# Patient Record
Sex: Male | Born: 1960
Health system: Southern US, Community
[De-identification: ages and names within clinical notes are randomized; demographics above are authoritative.]

---

## 2001-04-01 ENCOUNTER — Encounter (HOSPITAL_COMMUNITY): Admission: RE | Admit: 2001-04-01 | Discharge: 2001-05-01 | Payer: Self-pay | Admitting: Internal Medicine

## 2003-02-04 ENCOUNTER — Ambulatory Visit (HOSPITAL_COMMUNITY): Admission: RE | Admit: 2003-02-04 | Discharge: 2003-02-04 | Payer: Self-pay | Admitting: Internal Medicine

## 2003-02-04 ENCOUNTER — Encounter: Payer: Self-pay | Admitting: Internal Medicine

## 2005-03-23 ENCOUNTER — Ambulatory Visit (HOSPITAL_COMMUNITY): Admission: RE | Admit: 2005-03-23 | Discharge: 2005-03-23 | Payer: Self-pay | Admitting: Internal Medicine

## 2005-06-15 ENCOUNTER — Ambulatory Visit (HOSPITAL_COMMUNITY): Admission: RE | Admit: 2005-06-15 | Discharge: 2005-06-15 | Payer: Self-pay | Admitting: Internal Medicine

## 2010-10-17 ENCOUNTER — Ambulatory Visit (HOSPITAL_COMMUNITY)
Admission: RE | Admit: 2010-10-17 | Discharge: 2010-10-17 | Payer: Self-pay | Source: Home / Self Care | Attending: Internal Medicine | Admitting: Internal Medicine

## 2010-10-27 ENCOUNTER — Encounter (HOSPITAL_COMMUNITY)
Admission: RE | Admit: 2010-10-27 | Discharge: 2010-11-26 | Payer: Self-pay | Source: Home / Self Care | Attending: Internal Medicine | Admitting: Internal Medicine

## 2010-11-28 ENCOUNTER — Encounter (HOSPITAL_COMMUNITY)
Admission: RE | Admit: 2010-11-28 | Discharge: 2010-11-28 | Payer: Self-pay | Source: Home / Self Care | Attending: Internal Medicine | Admitting: Internal Medicine

## 2010-12-06 ENCOUNTER — Ambulatory Visit (HOSPITAL_COMMUNITY)
Admission: RE | Admit: 2010-12-06 | Discharge: 2010-12-06 | Disposition: A | Discharge status: Home / Self Care | Payer: No Typology Code available for payment source | Source: Ambulatory Visit | Attending: Internal Medicine | Admitting: Internal Medicine

## 2010-12-06 DIAGNOSIS — IMO0001 Reserved for inherently not codable concepts without codable children: Secondary | ICD-10-CM | POA: Insufficient documentation

## 2010-12-06 DIAGNOSIS — M545 Low back pain, unspecified: Secondary | ICD-10-CM | POA: Insufficient documentation

## 2010-12-06 DIAGNOSIS — R51 Headache: Secondary | ICD-10-CM | POA: Insufficient documentation

## 2010-12-06 DIAGNOSIS — M6281 Muscle weakness (generalized): Secondary | ICD-10-CM | POA: Insufficient documentation

## 2010-12-06 DIAGNOSIS — M542 Cervicalgia: Secondary | ICD-10-CM | POA: Insufficient documentation

## 2010-12-07 ENCOUNTER — Encounter (HOSPITAL_COMMUNITY)
Admission: RE | Admit: 2010-12-07 | Discharge: 2010-12-07 | Disposition: A | Discharge status: Home / Self Care | Payer: No Typology Code available for payment source | Source: Ambulatory Visit | Attending: Internal Medicine | Admitting: Internal Medicine

## 2010-12-07 DIAGNOSIS — M545 Low back pain, unspecified: Secondary | ICD-10-CM | POA: Insufficient documentation

## 2010-12-07 DIAGNOSIS — IMO0001 Reserved for inherently not codable concepts without codable children: Secondary | ICD-10-CM | POA: Insufficient documentation

## 2010-12-07 DIAGNOSIS — M539 Dorsopathy, unspecified: Secondary | ICD-10-CM | POA: Insufficient documentation

## 2010-12-12 ENCOUNTER — Encounter (HOSPITAL_COMMUNITY)
Admission: RE | Admit: 2010-12-12 | Discharge: 2010-12-12 | Disposition: A | Discharge status: Home / Self Care | Payer: No Typology Code available for payment source | Source: Ambulatory Visit | Attending: Internal Medicine | Admitting: Internal Medicine

## 2010-12-14 ENCOUNTER — Ambulatory Visit (HOSPITAL_COMMUNITY)
Admission: RE | Admit: 2010-12-14 | Discharge: 2010-12-14 | Disposition: A | Discharge status: Home / Self Care | Payer: No Typology Code available for payment source | Source: Ambulatory Visit | Attending: Internal Medicine | Admitting: Internal Medicine

## 2010-12-14 ENCOUNTER — Ambulatory Visit (HOSPITAL_COMMUNITY): Payer: No Typology Code available for payment source | Admitting: Physical Therapy

## 2010-12-14 DIAGNOSIS — M545 Low back pain, unspecified: Secondary | ICD-10-CM | POA: Insufficient documentation

## 2010-12-14 DIAGNOSIS — IMO0001 Reserved for inherently not codable concepts without codable children: Secondary | ICD-10-CM | POA: Insufficient documentation

## 2010-12-14 DIAGNOSIS — M6281 Muscle weakness (generalized): Secondary | ICD-10-CM | POA: Insufficient documentation

## 2010-12-14 DIAGNOSIS — M542 Cervicalgia: Secondary | ICD-10-CM | POA: Insufficient documentation

## 2010-12-19 ENCOUNTER — Ambulatory Visit (HOSPITAL_COMMUNITY)
Admission: RE | Admit: 2010-12-19 | Discharge: 2010-12-19 | Disposition: A | Discharge status: Home / Self Care | Payer: No Typology Code available for payment source | Source: Ambulatory Visit | Attending: Internal Medicine | Admitting: Internal Medicine

## 2010-12-19 DIAGNOSIS — IMO0001 Reserved for inherently not codable concepts without codable children: Secondary | ICD-10-CM | POA: Insufficient documentation

## 2010-12-19 DIAGNOSIS — M6281 Muscle weakness (generalized): Secondary | ICD-10-CM | POA: Insufficient documentation

## 2010-12-19 DIAGNOSIS — M542 Cervicalgia: Secondary | ICD-10-CM | POA: Insufficient documentation

## 2010-12-19 DIAGNOSIS — M545 Low back pain, unspecified: Secondary | ICD-10-CM | POA: Insufficient documentation

## 2010-12-21 ENCOUNTER — Ambulatory Visit (HOSPITAL_COMMUNITY)
Admission: RE | Admit: 2010-12-21 | Discharge: 2010-12-21 | Disposition: A | Discharge status: Home / Self Care | Payer: No Typology Code available for payment source | Source: Ambulatory Visit | Attending: *Deleted | Admitting: *Deleted

## 2011-11-08 ENCOUNTER — Emergency Department (HOSPITAL_COMMUNITY)
Admission: EM | Admit: 2011-11-08 | Discharge: 2011-11-08 | Disposition: A | Discharge status: Home / Self Care | Payer: No Typology Code available for payment source | Attending: Emergency Medicine | Admitting: Emergency Medicine

## 2011-11-08 ENCOUNTER — Encounter (HOSPITAL_COMMUNITY): Payer: Self-pay | Admitting: Emergency Medicine

## 2011-11-08 ENCOUNTER — Emergency Department (HOSPITAL_COMMUNITY): Payer: No Typology Code available for payment source

## 2011-11-08 DIAGNOSIS — F17200 Nicotine dependence, unspecified, uncomplicated: Secondary | ICD-10-CM | POA: Insufficient documentation

## 2011-11-08 DIAGNOSIS — R0789 Other chest pain: Secondary | ICD-10-CM | POA: Insufficient documentation

## 2011-11-08 DIAGNOSIS — Z7982 Long term (current) use of aspirin: Secondary | ICD-10-CM | POA: Insufficient documentation

## 2011-11-08 DIAGNOSIS — I4460 Unspecified fascicular block: Secondary | ICD-10-CM | POA: Insufficient documentation

## 2011-11-08 LAB — BASIC METABOLIC PANEL
Chloride: 106 mEq/L (ref 96–112)
Creatinine, Ser: 0.74 mg/dL (ref 0.50–1.35)
GFR calc Af Amer: >90 mL/min (ref >90)

## 2011-11-08 LAB — CBC: RBC: 4.61 MIL/uL (ref 4.22–5.81)

## 2011-11-08 LAB — CARDIAC PANEL(CRET KIN+CKTOT+MB+TROPI)
CK, MB: 2.4 ng/mL (ref 0.3–4.0)
Total CK: 152 U/L (ref 7–232)

## 2011-11-08 LAB — TROPONIN I: Troponin I: <0.3 ng/mL (ref <0.30)

## 2011-11-08 MED ORDER — ASPIRIN 81 MG PO CHEW
324.0000 mg | CHEWABLE_TABLET | Freq: Once | ORAL | Status: AC
  Administered 2011-11-08: 324 mg via ORAL
  Filled 2011-11-08: qty 4

## 2011-11-08 NOTE — ED Notes (Addendum)
Pt states he was told to come to ed by Dr.Fagan. Pt states he thinks he had a heart attack on Christmas eve, but was not seen.Pt c/o some chest discomfort and lightheadedness this am.

## 2011-11-08 NOTE — ED Provider Notes (Signed)
History     CSN: [id number]  Arrival date & time 11/08/11  4098   First MD Initiated Contact with Patient 11/08/11 1007      Chief Complaint  Patient presents with  . Chest Pain    (Consider location/radiation/quality/duration/timing/severity/associated sxs/prior treatment) Patient is a 51 y.o. male presenting with chest pain. The history is provided by the patient.  Chest Pain The chest pain began more than 2 weeks ago (He describes a 2-3 minute episode fo severe chest pain,  described as "my body was twisting around my heart" on christmas eve.  He reports the same episode about 6 months ago.). Episode Length: Had an episode of chest discomfort this am along with lightheadedness.  Stopped by his pcp and was sent here for testing.  He denies symptoms at present.  This mornings event occured at rest,  denies sob,  nausea or diaphoresis.   Chest pain occurs intermittently. The chest pain is resolved. Associated with: nothing. At its most intense, the pain is at 2/10. The pain is currently at 0/10. The severity of the pain is mild. The quality of the pain is described as brief and dull. The pain does not radiate (The pain on christmas eve radiated to his neck and shoulders.  Todays discomfort was without radiation). Pertinent negatives for primary symptoms include no fever, no shortness of breath, no cough, no palpitations, no abdominal pain, no nausea and no dizziness.  Pertinent negatives for associated symptoms include no claudication, no diaphoresis, no lower extremity edema, no near-syncope, no numbness, no orthopnea, no paroxysmal nocturnal dyspnea and no weakness. He tried nothing for the symptoms. Risk factors include smoking/tobacco exposure.  His past medical history is significant for cancer.  Pertinent negatives for past medical history include no CAD, no diabetes, no hyperlipidemia and no hypertension.  Pertinent negatives for family medical history include: no CAD in family, no  diabetes in family, no heart disease in family, no hyperlipidemia in family and no early MI in family.     History reviewed. No pertinent past medical history.  History reviewed. No pertinent past surgical history.  No family history on file.  History  Substance Use Topics  . Smoking status: Current Everyday Smoker  . Smokeless tobacco: Not on file  . Alcohol Use: Yes     rarely      Review of Systems  Constitutional: Negative for fever and diaphoresis.  HENT: Negative for congestion, sore throat and neck pain.   Eyes: Negative.   Respiratory: Negative for cough, chest tightness and shortness of breath.   Cardiovascular: Positive for chest pain. Negative for palpitations, orthopnea, claudication and near-syncope.  Gastrointestinal: Negative for nausea and abdominal pain.  Genitourinary: Negative.   Musculoskeletal: Negative for joint swelling and arthralgias.  Skin: Negative.  Negative for rash and wound.  Neurological: Negative for dizziness, weakness, light-headedness, numbness and headaches.  Hematological: Negative.   Psychiatric/Behavioral: Negative.     Allergies  Review of patient's allergies indicates no known allergies.  Home Medications   Current Outpatient Rx  Name Route Sig Dispense Refill  . ASPIRIN EC 81 MG PO TBEC Oral Take 81 mg by mouth daily.      BP 112/73  Pulse 56  Temp 98.1 F (36.7 C)  Resp 13  Ht 5\' 9"  (1.753 m)  Wt 165 lb (74.844 kg)  BMI 24.37 kg/m2  SpO2 97%  Physical Exam  Nursing note and vitals reviewed. Constitutional: He is oriented to person, place, and time. He appears  well-developed and well-nourished.  HENT:  Head: Normocephalic and atraumatic.  Eyes: Conjunctivae are normal.  Neck: Normal range of motion.  Cardiovascular: Normal rate, regular rhythm, normal heart sounds and intact distal pulses.  Exam reveals no gallop and no friction rub.   No murmur heard. Pulmonary/Chest: Effort normal and breath sounds normal. He  has no wheezes. He exhibits no tenderness.  Abdominal: Soft. Bowel sounds are normal. There is no tenderness.  Musculoskeletal: Normal range of motion.  Neurological: He is alert and oriented to person, place, and time.  Skin: Skin is warm and dry.  Psychiatric: He has a normal mood and affect.    ED Course  Procedures (including critical care time)  Labs Reviewed  BASIC METABOLIC PANEL - Abnormal; Notable for the following:    Glucose, Bld 110 (*)    All other components within normal limits  CBC  CARDIAC PANEL(CRET KIN+CKTOT+MB+TROPI)  TROPONIN I  LAB REPORT - SCANNED   No results found.   1. Chest pain, atypical       MDM  Troponin x 2 normal range.  Low risk for CAD.  Patient sx free throughout ed visit.  Discussed with Dr. Colon Branch prior to disposition.  Referral to pcp for recheck this week.     Date: 11/08/2011  Rate: 59  Rhythm: sinus bradycardia  QRS Axis: normal  Intervals: normal  ST/T Wave abnormalities: normal  Conduction Disutrbances:left anterior fascicular block  Narrative Interpretation:   Old EKG Reviewed: none available       Candis Musa, PA 11/10/11 1243

## 2011-11-08 NOTE — ED Notes (Signed)
Pt states that he is pretty sure that he had a heart attack on Christmas Eve but did not act on it. Pt states that he went to his MD this morning and was sent to the ED. Pt alert and oriented x 3. Skin warm and dry. Color pink. Breath sounds clear and equal bilaterally. Pt placed on cardiac monitor showing NSR. Pt c/o some discomfort in his chest earlier this am but none at present. Denies shortness of breath or nausea. States that this is the first discomfort since the severe chest pain on Christmas Eve.

## 2011-11-08 NOTE — ED Notes (Signed)
edpa in to eval 

## 2011-11-10 NOTE — ED Provider Notes (Signed)
Medical screening examination/treatment/procedure(s) were performed by non-physician practitioner and as supervising physician I was immediately available for consultation/collaboration.  Nicoletta Dress. Colon Branch, MD 11/10/11 2224

## 2016-02-14 DIAGNOSIS — M2550 Pain in unspecified joint: Secondary | ICD-10-CM | POA: Diagnosis not present

## 2016-02-15 DIAGNOSIS — M2550 Pain in unspecified joint: Secondary | ICD-10-CM | POA: Diagnosis not present

## 2016-02-15 DIAGNOSIS — Z79899 Other long term (current) drug therapy: Secondary | ICD-10-CM | POA: Diagnosis not present

## 2016-04-03 DIAGNOSIS — F419 Anxiety disorder, unspecified: Secondary | ICD-10-CM | POA: Diagnosis not present

## 2016-04-03 DIAGNOSIS — M1990 Unspecified osteoarthritis, unspecified site: Secondary | ICD-10-CM | POA: Diagnosis not present

## 2016-05-25 DIAGNOSIS — M1990 Unspecified osteoarthritis, unspecified site: Secondary | ICD-10-CM | POA: Diagnosis not present

## 2016-05-25 DIAGNOSIS — F411 Generalized anxiety disorder: Secondary | ICD-10-CM | POA: Diagnosis not present

## 2016-06-28 DIAGNOSIS — F411 Generalized anxiety disorder: Secondary | ICD-10-CM | POA: Diagnosis not present

## 2016-10-26 DIAGNOSIS — F411 Generalized anxiety disorder: Secondary | ICD-10-CM | POA: Diagnosis not present

## 2017-01-24 DIAGNOSIS — M545 Low back pain: Secondary | ICD-10-CM | POA: Diagnosis not present

## 2017-02-26 DIAGNOSIS — R454 Irritability and anger: Secondary | ICD-10-CM | POA: Diagnosis not present

## 2017-06-13 DIAGNOSIS — B349 Viral infection, unspecified: Secondary | ICD-10-CM | POA: Diagnosis not present

## 2017-12-06 DIAGNOSIS — R454 Irritability and anger: Secondary | ICD-10-CM | POA: Diagnosis not present

## 2017-12-06 DIAGNOSIS — Z6826 Body mass index (BMI) 26.0-26.9, adult: Secondary | ICD-10-CM | POA: Diagnosis not present

## 2017-12-06 DIAGNOSIS — J329 Chronic sinusitis, unspecified: Secondary | ICD-10-CM | POA: Diagnosis not present

## 2018-01-03 DIAGNOSIS — R454 Irritability and anger: Secondary | ICD-10-CM | POA: Diagnosis not present

## 2018-04-11 DIAGNOSIS — R454 Irritability and anger: Secondary | ICD-10-CM | POA: Diagnosis not present

## 2018-04-14 ENCOUNTER — Emergency Department (HOSPITAL_COMMUNITY): Payer: BLUE CROSS/BLUE SHIELD

## 2018-04-14 ENCOUNTER — Encounter (HOSPITAL_COMMUNITY): Payer: Self-pay | Admitting: *Deleted

## 2018-04-14 ENCOUNTER — Other Ambulatory Visit: Payer: Self-pay

## 2018-04-14 ENCOUNTER — Emergency Department (HOSPITAL_COMMUNITY)
Admission: EM | Admit: 2018-04-14 | Discharge: 2018-04-14 | Disposition: A | Discharge status: Home / Self Care | Payer: BLUE CROSS/BLUE SHIELD | Attending: Emergency Medicine | Admitting: Emergency Medicine

## 2018-04-14 DIAGNOSIS — F17210 Nicotine dependence, cigarettes, uncomplicated: Secondary | ICD-10-CM | POA: Diagnosis not present

## 2018-04-14 DIAGNOSIS — R109 Unspecified abdominal pain: Secondary | ICD-10-CM | POA: Diagnosis not present

## 2018-04-14 DIAGNOSIS — R110 Nausea: Secondary | ICD-10-CM | POA: Diagnosis not present

## 2018-04-14 DIAGNOSIS — N200 Calculus of kidney: Secondary | ICD-10-CM | POA: Diagnosis not present

## 2018-04-14 DIAGNOSIS — N23 Unspecified renal colic: Secondary | ICD-10-CM | POA: Diagnosis not present

## 2018-04-14 LAB — CBC WITH DIFFERENTIAL/PLATELET
BASOS ABS: 0 10*3/uL (ref 0.0–0.1)
Basophils Relative: 0 %
EOS ABS: 0.1 10*3/uL (ref 0.0–0.7)
EOS PCT: 0 %
HCT: 45.2 % (ref 39.0–52.0)
Hemoglobin: 14.9 g/dL (ref 13.0–17.0)
LYMPHS ABS: 1.9 10*3/uL (ref 0.7–4.0)
Lymphocytes Relative: 12 %
MCH: 31.2 pg (ref 26.0–34.0)
MCHC: 33 g/dL (ref 30.0–36.0)
MCV: 94.6 fL (ref 78.0–100.0)
Monocytes Absolute: 0.7 10*3/uL (ref 0.1–1.0)
Monocytes Relative: 4 %
Neutro Abs: 13.4 10*3/uL — ABNORMAL HIGH (ref 1.7–7.7)
Neutrophils Relative %: 84 %
PLATELETS: 261 10*3/uL (ref 150–400)
RBC: 4.78 MIL/uL (ref 4.22–5.81)
RDW: 12.8 % (ref 11.5–15.5)
WBC: 16.1 10*3/uL — AB (ref 4.0–10.5)

## 2018-04-14 LAB — BASIC METABOLIC PANEL
Anion gap: 8 (ref 5–15)
BUN: 19 mg/dL (ref 6–20)
CALCIUM: 9.5 mg/dL (ref 8.9–10.3)
CO2: 30 mmol/L (ref 22–32)
Chloride: 103 mmol/L (ref 101–111)
Creatinine, Ser: 1.07 mg/dL (ref 0.61–1.24)
GFR calc Af Amer: >60 mL/min (ref >60)
Glucose, Bld: 164 mg/dL — ABNORMAL HIGH (ref 65–99)
Potassium: 3.7 mmol/L (ref 3.5–5.1)
SODIUM: 141 mmol/L (ref 135–145)

## 2018-04-14 LAB — URINALYSIS, ROUTINE W REFLEX MICROSCOPIC
Bacteria, UA: NONE SEEN
Bilirubin Urine: NEGATIVE
Glucose, UA: NEGATIVE mg/dL
Ketones, ur: NEGATIVE mg/dL
Leukocytes, UA: NEGATIVE
Nitrite: NEGATIVE
PH: 6 (ref 5.0–8.0)
Protein, ur: NEGATIVE mg/dL
SPECIFIC GRAVITY, URINE: 1.02 (ref 1.005–1.030)

## 2018-04-14 MED ORDER — OXYCODONE-ACETAMINOPHEN 5-325 MG PO TABS: 1.0000 | ORAL_TABLET | Freq: Three times a day (TID) | ORAL | 0 refills | Status: AC | PRN

## 2018-04-14 MED ORDER — TAMSULOSIN HCL 0.4 MG PO CAPS: 0.4000 mg | ORAL_CAPSULE | Freq: Every day | ORAL | 0 refills | Status: AC

## 2018-04-14 MED ORDER — SODIUM CHLORIDE 0.9 % IV BOLUS
1000.0000 mL | Freq: Once | INTRAVENOUS | Status: AC
  Administered 2018-04-14: 1000 mL via INTRAVENOUS

## 2018-04-14 MED ORDER — MORPHINE SULFATE (PF) 4 MG/ML IV SOLN
6.0000 mg | Freq: Once | INTRAVENOUS | Status: AC
  Administered 2018-04-14: 6 mg via INTRAVENOUS
  Filled 2018-04-14: qty 2

## 2018-04-14 MED ORDER — TAMSULOSIN HCL 0.4 MG PO CAPS
0.4000 mg | ORAL_CAPSULE | Freq: Once | ORAL | Status: AC
  Administered 2018-04-14: 0.4 mg via ORAL
  Filled 2018-04-14: qty 1

## 2018-04-14 MED ORDER — ONDANSETRON HCL 4 MG/2ML IJ SOLN
4.0000 mg | Freq: Once | INTRAMUSCULAR | Status: AC
  Administered 2018-04-14: 4 mg via INTRAVENOUS
  Filled 2018-04-14: qty 2

## 2018-04-14 MED ORDER — ONDANSETRON HCL 4 MG PO TABS: 4.0000 mg | ORAL_TABLET | Freq: Three times a day (TID) | ORAL | 0 refills | Status: AC | PRN

## 2018-04-14 MED ORDER — KETOROLAC TROMETHAMINE 30 MG/ML IJ SOLN
30.0000 mg | Freq: Once | INTRAMUSCULAR | Status: AC
  Administered 2018-04-14: 30 mg via INTRAVENOUS
  Filled 2018-04-14: qty 1

## 2018-04-14 MED ORDER — IBUPROFEN 800 MG PO TABS: 800.0000 mg | ORAL_TABLET | Freq: Three times a day (TID) | ORAL | 0 refills | Status: AC

## 2018-04-14 NOTE — ED Notes (Signed)
Bladder scan was performed at 0346 highest result showed .

## 2018-04-14 NOTE — ED Provider Notes (Signed)
Emergency Department Provider Note   I have reviewed the triage vital signs and the nursing notes.   HISTORY  Chief Complaint Flank Pain   HPI Bill Garcia is a 57 y.o. male without significant past medical history the presents to the emergency department today with flank pain.  Patient states that he has some intermittent left-sided pain for the last week or so but no urinary symptoms but then this morning at 2:00 he woke up with severe left-sided flank pain that radiated around towards his groin.  Had a small amount of urination but has not been able urinate since then even though he has continued urge.  Has had some nausea with this as well.  Has taken ibuprofen at home which did not seem to help with symptoms.  No history of kidney stones or urinary tract infections.  No trauma. No other associated or modifying symptoms.    History reviewed. No pertinent past medical history.  There are no active problems to display for this patient.   History reviewed. No pertinent surgical history.  Current Outpatient Rx  . Order #: [id number] Class: Historical Med  . Order #: [id number] Class: Print  . Order #: [id number] Class: Print  . Order #: [id number] Class: Print  . Order #: [id number] Class: Print    Allergies Patient has no known allergies.  No family history on file.  Social History Social History   Tobacco Use  . Smoking status: Current Every Day Smoker  . Smokeless tobacco: Never Used  Substance Use Topics  . Alcohol use: Yes    Comment: rarely  . Drug use: No    Review of Systems  All other systems negative except as documented in the HPI. All pertinent positives and negatives as reviewed in the HPI. ____________________________________________   PHYSICAL EXAM:  VITAL SIGNS: ED Triage Vitals [04/14/18 0330]  Enc Vitals Group     BP (!) 160/80     Pulse Rate 60     Resp (!) 21     Temp (!) 97.4 F (36.3 C)     Temp Source Oral     SpO2 100 %     Weight  174 lb (78.9 kg)     Height 5\' 8"  (1.727 m)    Constitutional: Alert and oriented. Well appearing and in obvious discomfort. Eyes: Conjunctivae are normal. PERRL. EOMI. Head: Atraumatic. Nose: No congestion/rhinnorhea. Mouth/Throat: Mucous membranes are moist.  Oropharynx non-erythematous. Neck: No stridor.  No meningeal signs.   Cardiovascular: Normal rate, regular rhythm. Good peripheral circulation. Grossly normal heart sounds.   Respiratory: Normal respiratory effort.  No retractions. Lungs CTAB. Gastrointestinal: Soft and ttp in suprapubic area. No distention.  Musculoskeletal: No lower extremity tenderness nor edema. No gross deformities of extremities. Left cva ttp. Neurologic:  Normal speech and language. No gross focal neurologic deficits are appreciated.  Skin:  Skin is warm, dry and intact. No rash noted.  ____________________________________________   LABS (all labs ordered are listed, but only abnormal results are displayed)  Labs Reviewed  CBC WITH DIFFERENTIAL/PLATELET - Abnormal; Notable for the following components:      Result Value   WBC 16.1 (*)    Neutro Abs 13.4 (*)    All other components within normal limits  BASIC METABOLIC PANEL - Abnormal; Notable for the following components:   Glucose, Bld 164 (*)    All other components within normal limits  URINALYSIS, ROUTINE W REFLEX MICROSCOPIC - Abnormal; Notable for the following components:   APPearance HAZY (*)  Hgb urine dipstick SMALL (*)    All other components within normal limits   ____________________________________________  RADIOLOGY  Ct Renal Stone Study  Result Date: 04/14/2018 CLINICAL DATA:  Left flank pain with nausea. Woke patient from sleep. EXAM: CT ABDOMEN AND PELVIS WITHOUT CONTRAST TECHNIQUE: Multidetector CT imaging of the abdomen and pelvis was performed following the standard protocol without IV contrast. COMPARISON:  None. FINDINGS: Lower chest: Lung bases are clear.  Hepatobiliary: No focal liver abnormality is seen. No gallstones, gallbladder wall thickening, or biliary dilatation. Pancreas: Unremarkable. No pancreatic ductal dilatation or surrounding inflammatory changes. Spleen: Normal in size without focal abnormality. Adrenals/Urinary Tract: No adrenal gland nodules. 3 mm stone in the left bladder base may represent a recently passed stone or a stone in the distal ureterovesical junction. There is hydronephrosis and hydroureter on the left with stranding around the left kidney in ureter suggesting sequela of obstruction. Diffuse bladder wall thickening may indicate cystitis. Punctate sized stones demonstrated in both kidneys. No hydronephrosis or hydroureter on the right. Vague low-attenuation lesion in the medial left kidney likely representing a small cyst. Stomach/Bowel: Stomach is within normal limits. Appendix appears normal. No evidence of bowel wall thickening, distention, or inflammatory changes. Vascular/Lymphatic: Aortic atherosclerosis. No enlarged abdominal or pelvic lymph nodes. Reproductive: Prostate gland is not enlarged. Other: No free air or free fluid in the abdomen. Abdominal wall musculature appears intact. Musculoskeletal: No acute or significant osseous findings. IMPRESSION: 1. 3 mm stone in the left bladder base may represent a recently passed stone into the bladder or a stone in the distal ureterovesical junction. Moderate postobstructive changes in the left kidney and ureter. 2. Multiple bilateral nonobstructing intrarenal stones. 3. Bladder wall thickening may indicate cystitis. 4. Aortic atherosclerosis. Electronically Signed   By: Burman Nieves M.D.   On: 04/14/2018 04:45    ____________________________________________   PROCEDURES  Procedure(s) performed:   Procedures   ____________________________________________   INITIAL IMPRESSION / ASSESSMENT AND PLAN / ED COURSE  Pain meds/antiemetics, eval for stone vs uti.  Found  to have stone. Toradol/flomax given. Pain controlled. Nausea controlled. No infection. Stable for dc on same meds.      Pertinent labs & imaging results that were available during my care of the patient were reviewed by me and considered in my medical decision making (see chart for details).  ____________________________________________  FINAL CLINICAL IMPRESSION(S) / ED DIAGNOSES  Final diagnoses:  Renal colic  Kidney stone     MEDICATIONS GIVEN DURING THIS VISIT:  Medications  morphine 4 MG/ML injection 6 mg (6 mg Intravenous Given 04/14/18 0401)  ondansetron (ZOFRAN) injection 4 mg (4 mg Intravenous Given 04/14/18 0401)  sodium chloride 0.9 % bolus 1,000 mL (0 mLs Intravenous Stopped 04/14/18 0518)  ketorolac (TORADOL) 30 MG/ML injection 30 mg (30 mg Intravenous Given 04/14/18 0525)  tamsulosin (FLOMAX) capsule 0.4 mg (0.4 mg Oral Given 04/14/18 0525)     NEW OUTPATIENT MEDICATIONS STARTED DURING THIS VISIT:  Discharge Medication List as of 04/14/2018  6:45 AM    START taking these medications   Details  ibuprofen (ADVIL,MOTRIN) 800 MG tablet Take 1 tablet (800 mg total) by mouth 3 (three) times daily., Starting Mon 04/14/2018, Print    ondansetron (ZOFRAN) 4 MG tablet Take 1 tablet (4 mg total) by mouth every 8 (eight) hours as needed for nausea or vomiting., Starting Mon 04/14/2018, Print    oxyCODONE-acetaminophen (PERCOCET/ROXICET) 5-325 MG tablet Take 1-2 tablets by mouth every 8 (eight) hours as needed for severe  pain., Starting Mon 04/14/2018, Print    tamsulosin (FLOMAX) 0.4 MG CAPS capsule Take 1 capsule (0.4 mg total) by mouth daily., Starting Mon 04/14/2018, Print        Note:  This note was prepared with assistance of Dragon voice recognition software. Occasional wrong-word or sound-a-like substitutions may have occurred due to the inherent limitations of voice recognition software.   Tavie Haseman, Barbara Cower, MD 04/14/18 [phone]

## 2018-04-14 NOTE — ED Notes (Signed)
ED Provider at bedside. 

## 2018-04-14 NOTE — ED Triage Notes (Signed)
Pt c/o left flank pain that woke him up from sleep with nausea,

## 2018-04-30 DIAGNOSIS — N1330 Unspecified hydronephrosis: Secondary | ICD-10-CM | POA: Diagnosis not present

## 2018-05-02 DIAGNOSIS — R1084 Generalized abdominal pain: Secondary | ICD-10-CM | POA: Diagnosis not present

## 2018-05-02 DIAGNOSIS — N400 Enlarged prostate without lower urinary tract symptoms: Secondary | ICD-10-CM | POA: Diagnosis not present

## 2018-05-02 DIAGNOSIS — N130 Hydronephrosis with ureteropelvic junction obstruction: Secondary | ICD-10-CM | POA: Diagnosis not present

## 2018-05-02 DIAGNOSIS — N200 Calculus of kidney: Secondary | ICD-10-CM | POA: Diagnosis not present

## 2018-05-30 DIAGNOSIS — R05 Cough: Secondary | ICD-10-CM | POA: Diagnosis not present

## 2018-06-06 ENCOUNTER — Ambulatory Visit (HOSPITAL_COMMUNITY)
Admission: RE | Admit: 2018-06-06 | Discharge: 2018-06-06 | Disposition: A | Discharge status: Home / Self Care | Payer: BLUE CROSS/BLUE SHIELD | Source: Ambulatory Visit | Attending: Internal Medicine | Admitting: Internal Medicine

## 2018-06-06 ENCOUNTER — Other Ambulatory Visit (HOSPITAL_COMMUNITY): Payer: Self-pay | Admitting: Internal Medicine

## 2018-06-06 DIAGNOSIS — R059 Cough, unspecified: Secondary | ICD-10-CM

## 2018-06-06 DIAGNOSIS — R05 Cough: Secondary | ICD-10-CM | POA: Diagnosis not present

## 2018-08-15 DIAGNOSIS — R7301 Impaired fasting glucose: Secondary | ICD-10-CM | POA: Diagnosis not present

## 2018-08-15 DIAGNOSIS — Z79899 Other long term (current) drug therapy: Secondary | ICD-10-CM | POA: Diagnosis not present

## 2018-08-15 DIAGNOSIS — Z125 Encounter for screening for malignant neoplasm of prostate: Secondary | ICD-10-CM | POA: Diagnosis not present

## 2018-08-18 DIAGNOSIS — R05 Cough: Secondary | ICD-10-CM | POA: Diagnosis not present

## 2018-09-29 DIAGNOSIS — R05 Cough: Secondary | ICD-10-CM | POA: Diagnosis not present

## 2018-11-24 DIAGNOSIS — R454 Irritability and anger: Secondary | ICD-10-CM | POA: Diagnosis not present

## 2018-11-24 DIAGNOSIS — R05 Cough: Secondary | ICD-10-CM | POA: Diagnosis not present

## 2019-04-09 IMAGING — CR DG CHEST 2V
2 series · 2 of 2 positions shown · non-contrast
Comparison: 11/08/2011

CLINICAL DATA: Productive cough for several months.

EXAM:
CHEST - 2 VIEW

[w chest pa]
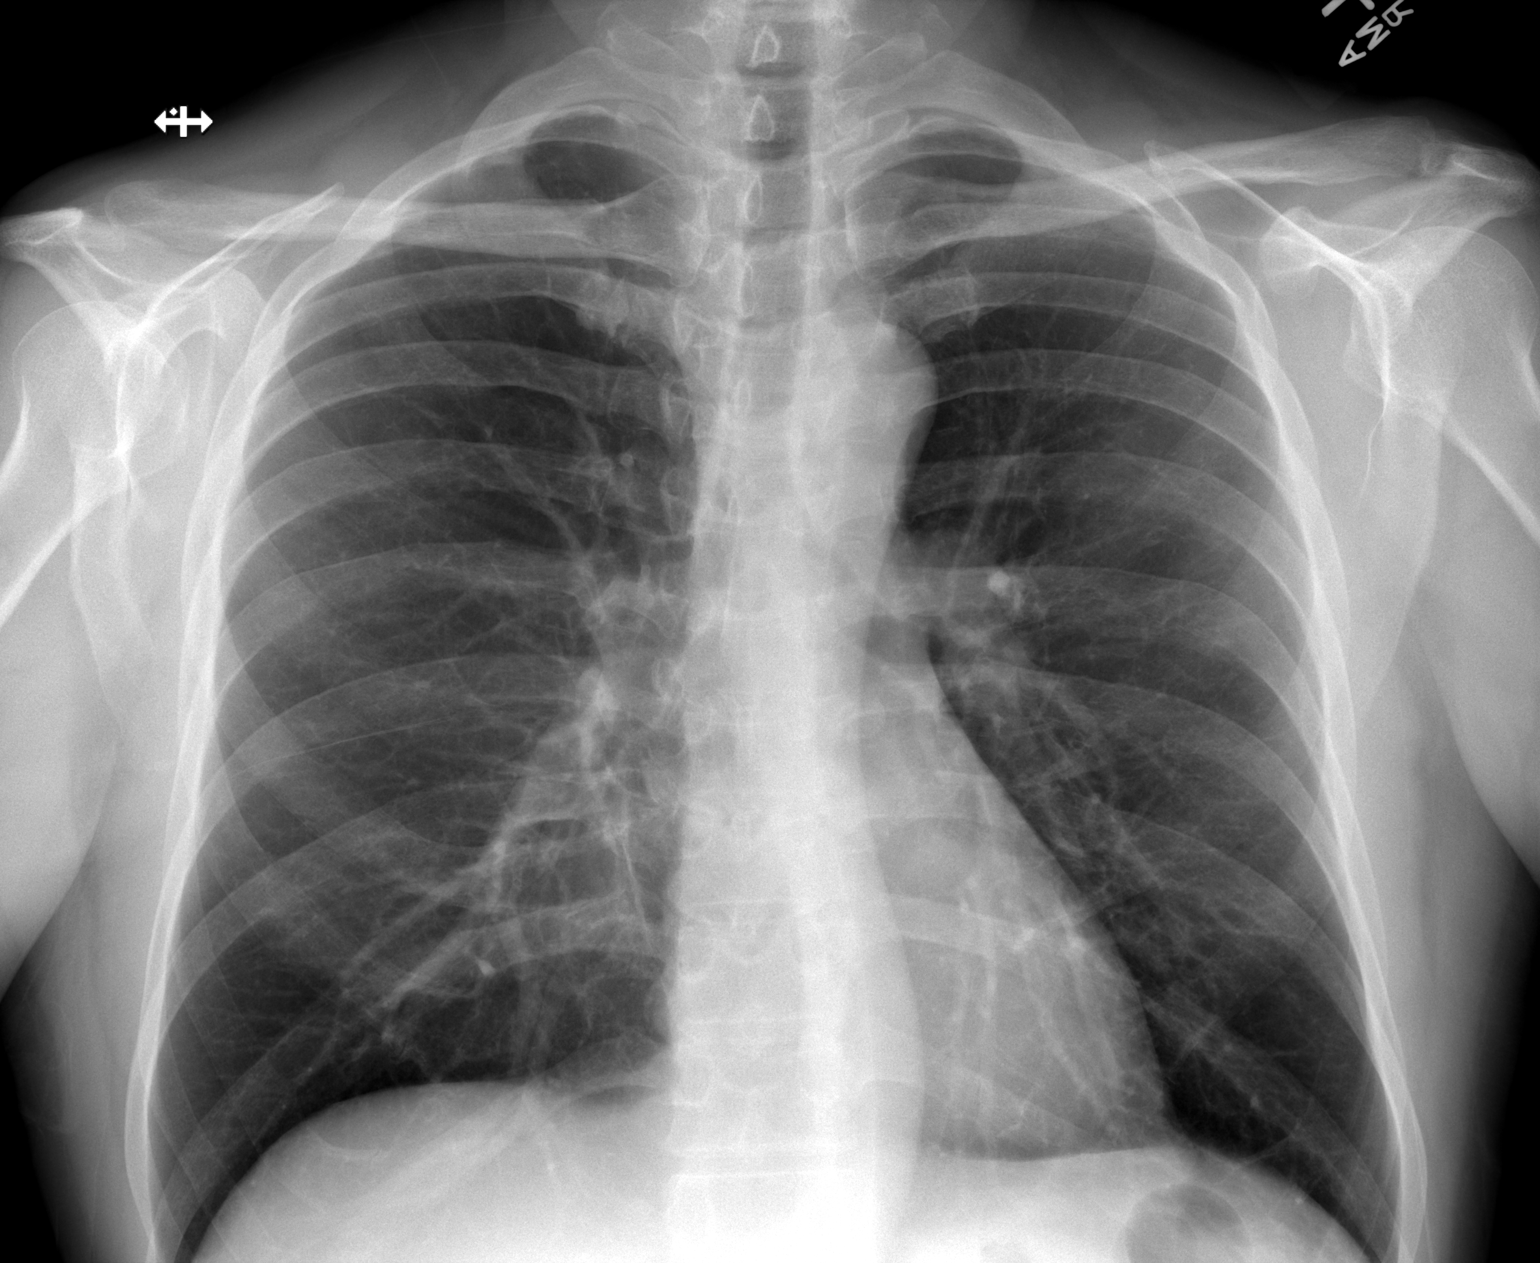

[w chest lat]
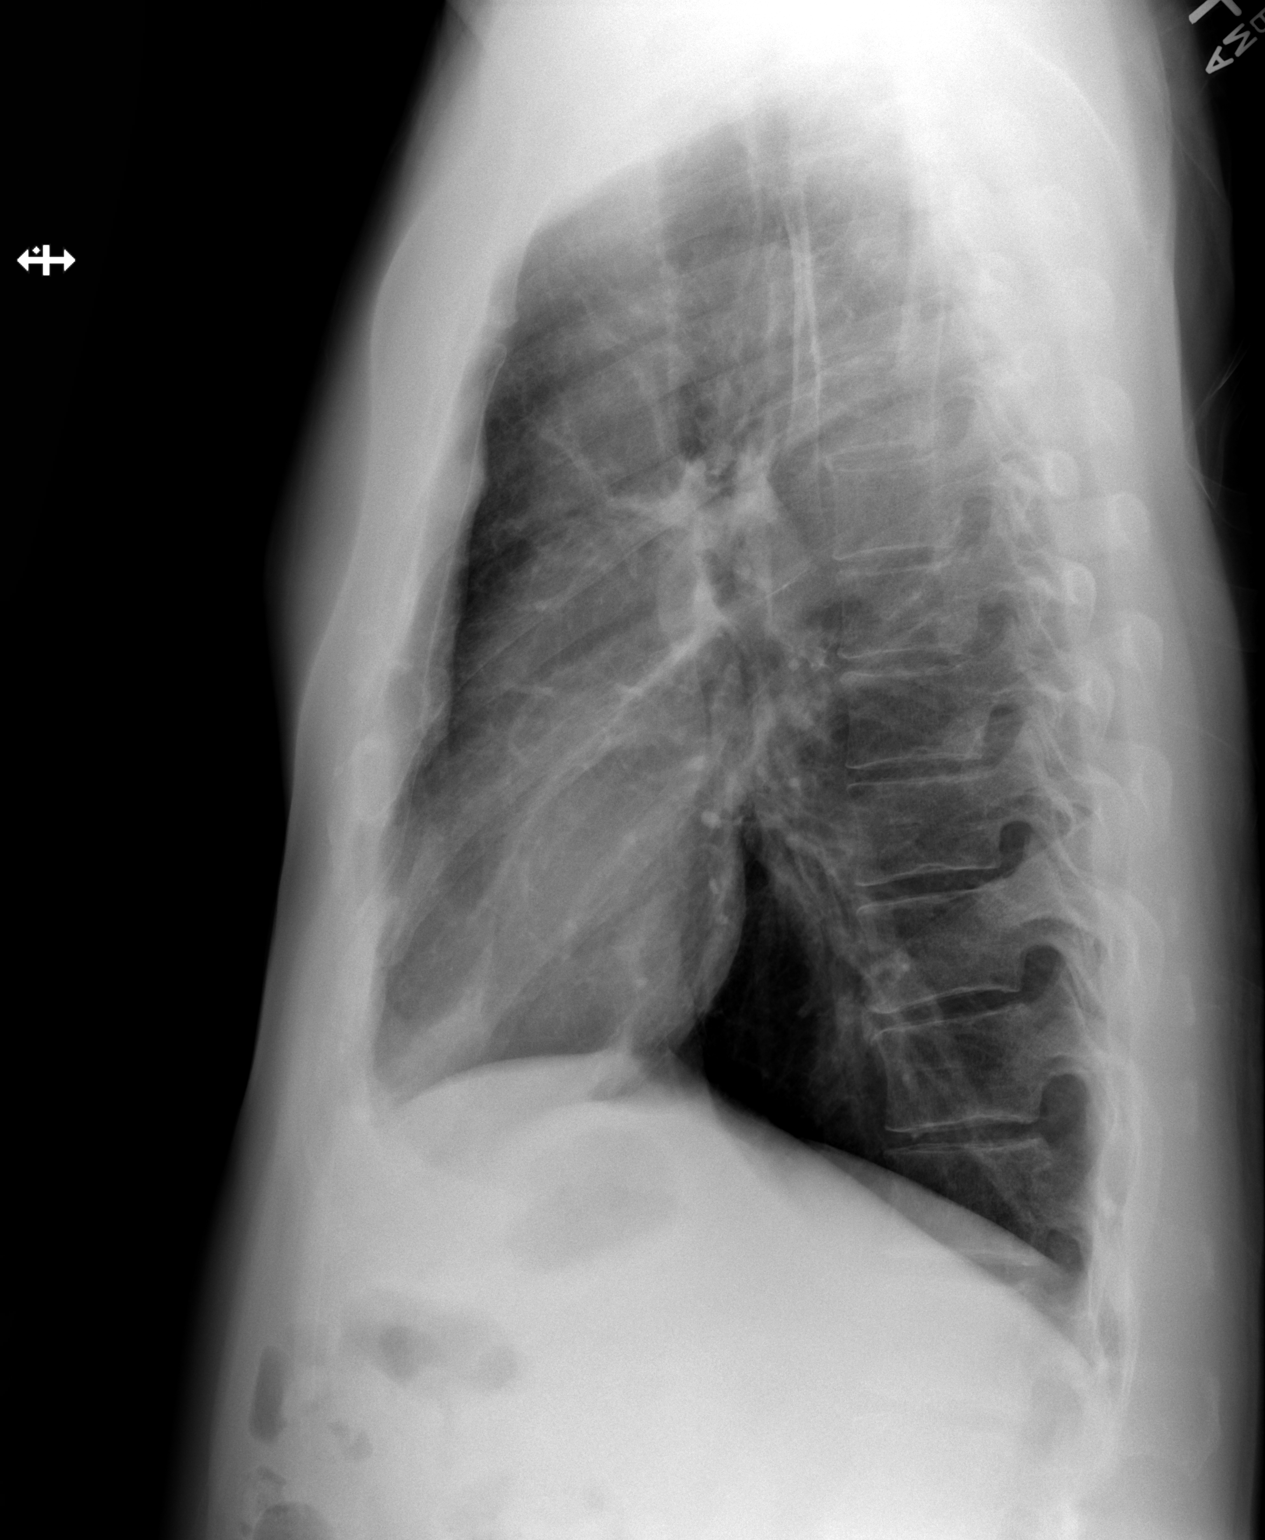

[2 of 2 positions shown; findings below may reference images not displayed]

FINDINGS: The heart size and mediastinal contours are within normal limits.
Both lungs are clear. No pleural effusion or pneumothorax. The
visualized skeletal structures are unremarkable.
IMPRESSION: No active cardiopulmonary disease.

## 2019-08-10 DIAGNOSIS — R05 Cough: Secondary | ICD-10-CM | POA: Diagnosis not present

## 2019-08-10 DIAGNOSIS — R454 Irritability and anger: Secondary | ICD-10-CM | POA: Diagnosis not present

## 2019-11-05 DIAGNOSIS — Z79899 Other long term (current) drug therapy: Secondary | ICD-10-CM | POA: Diagnosis not present

## 2019-11-05 DIAGNOSIS — R454 Irritability and anger: Secondary | ICD-10-CM | POA: Diagnosis not present

## 2019-11-05 DIAGNOSIS — J45991 Cough variant asthma: Secondary | ICD-10-CM | POA: Diagnosis not present

## 2019-11-05 DIAGNOSIS — Z125 Encounter for screening for malignant neoplasm of prostate: Secondary | ICD-10-CM | POA: Diagnosis not present

## 2019-11-12 DIAGNOSIS — R454 Irritability and anger: Secondary | ICD-10-CM | POA: Diagnosis not present

## 2019-11-12 DIAGNOSIS — R05 Cough: Secondary | ICD-10-CM | POA: Diagnosis not present

## 2019-12-01 ENCOUNTER — Other Ambulatory Visit: Payer: Self-pay

## 2019-12-01 ENCOUNTER — Ambulatory Visit: Payer: BC Managed Care – PPO | Attending: Internal Medicine

## 2019-12-01 DIAGNOSIS — Z20822 Contact with and (suspected) exposure to covid-19: Secondary | ICD-10-CM | POA: Diagnosis not present

## 2019-12-02 LAB — NOVEL CORONAVIRUS, NAA: SARS-CoV-2, NAA: DETECTED — AB

## 2021-12-27 DIAGNOSIS — M25561 Pain in right knee: Secondary | ICD-10-CM | POA: Diagnosis not present

## 2021-12-27 DIAGNOSIS — M25562 Pain in left knee: Secondary | ICD-10-CM | POA: Diagnosis not present

## 2021-12-27 DIAGNOSIS — M2241 Chondromalacia patellae, right knee: Secondary | ICD-10-CM | POA: Diagnosis not present

## 2021-12-27 DIAGNOSIS — M2242 Chondromalacia patellae, left knee: Secondary | ICD-10-CM | POA: Diagnosis not present

## 2022-03-06 DIAGNOSIS — R051 Acute cough: Secondary | ICD-10-CM | POA: Diagnosis not present

## 2022-07-25 DIAGNOSIS — M2242 Chondromalacia patellae, left knee: Secondary | ICD-10-CM | POA: Diagnosis not present

## 2022-07-25 DIAGNOSIS — M2241 Chondromalacia patellae, right knee: Secondary | ICD-10-CM | POA: Diagnosis not present

## 2023-01-14 DIAGNOSIS — R454 Irritability and anger: Secondary | ICD-10-CM | POA: Diagnosis not present

## 2023-01-14 DIAGNOSIS — F419 Anxiety disorder, unspecified: Secondary | ICD-10-CM | POA: Diagnosis not present

## 2023-01-14 DIAGNOSIS — Z125 Encounter for screening for malignant neoplasm of prostate: Secondary | ICD-10-CM | POA: Diagnosis not present

## 2023-01-21 DIAGNOSIS — R454 Irritability and anger: Secondary | ICD-10-CM | POA: Diagnosis not present

## 2023-01-21 DIAGNOSIS — R972 Elevated prostate specific antigen [PSA]: Secondary | ICD-10-CM | POA: Diagnosis not present

## 2023-02-25 ENCOUNTER — Other Ambulatory Visit: Payer: Self-pay | Admitting: *Deleted

## 2023-02-25 DIAGNOSIS — Z125 Encounter for screening for malignant neoplasm of prostate: Secondary | ICD-10-CM

## 2023-02-27 ENCOUNTER — Telehealth: Payer: Self-pay

## 2023-02-27 NOTE — Telephone Encounter (Signed)
Normal PSA result letter mailed.   Feb 27, 2023         Dear Bill Garcia,   Thank you for your participation in the Prostate Cancer Screening at [facility] [phone]) on Monday, February 25, 2023.   The result of your blood test for the level of the Prostate Specific Antigen (PSA) was found to be normal. It is recommended that continue yearly screening and share these results with your physician.  If you have any questions about these results, please call Bill Garcia at [phone] or Bill Garcia at [phone].  Sincerely,     Bill Mu, MSN, RN,OCN Oncology Outreach Manager [facility]   Bill Dupes, LPN Oncology Outreach  [facility]

## 2023-02-28 NOTE — Progress Notes (Signed)
Patient: Bill Garcia           Date of Birth: 1961-06-15           MRN: [id number] Visit Date: 02/25/2023 PCP: Carylon Perches, MD  Prostate Cancer Screening Date of last physical exam: 01/25/23 (Blood work) Date of last rectal exam:  (3 years ago) Have you ever had any of the following?: None Have you ever had or been told you have an allergy to latex products?: No Are you currently taking any natural prostate preparations?: No Are you currently experiencing any urinary symptoms?: Yes If yes, please explain::  (Some nights waking up frequently to urinate)  Prostate Exam Exam not completed. PSA Only.  Patient's History There are no problems to display for this patient.  No past medical history on file.  No family history on file.  Social History   Occupational History   Not on file  Tobacco Use   Smoking status: Every Day   Smokeless tobacco: Never  Substance and Sexual Activity   Alcohol use: Yes    Comment: rarely   Drug use: No   Sexual activity: Not on file

## 2023-03-15 ENCOUNTER — Ambulatory Visit: Payer: BC Managed Care – PPO | Admitting: Urology

## 2023-04-16 DIAGNOSIS — E875 Hyperkalemia: Secondary | ICD-10-CM | POA: Diagnosis not present

## 2023-04-16 DIAGNOSIS — E870 Hyperosmolality and hypernatremia: Secondary | ICD-10-CM | POA: Diagnosis not present

## 2023-04-25 DIAGNOSIS — R450 Nervousness: Secondary | ICD-10-CM | POA: Diagnosis not present

## 2023-04-25 DIAGNOSIS — E875 Hyperkalemia: Secondary | ICD-10-CM | POA: Diagnosis not present

## 2023-05-30 ENCOUNTER — Ambulatory Visit (HOSPITAL_COMMUNITY): Payer: BC Managed Care – PPO | Attending: Internal Medicine | Admitting: Occupational Therapy

## 2023-05-30 ENCOUNTER — Encounter (HOSPITAL_COMMUNITY): Payer: Self-pay | Admitting: Occupational Therapy

## 2023-05-30 ENCOUNTER — Other Ambulatory Visit: Payer: Self-pay

## 2023-05-30 DIAGNOSIS — M79631 Pain in right forearm: Secondary | ICD-10-CM

## 2023-05-30 DIAGNOSIS — R29898 Other symptoms and signs involving the musculoskeletal system: Secondary | ICD-10-CM | POA: Diagnosis not present

## 2023-05-30 NOTE — Therapy (Signed)
OUTPATIENT OCCUPATIONAL THERAPY ORTHO EVALUATION  Patient Name: Bill Garcia MRN: [id number] DOB:May 21, 1961, 62 y.o., male Today's Date: 05/31/2023  PCP: Carylon Perches, MD REFERRING PROVIDER: Carylon Perches, MD  END OF SESSION:  OT End of Session - 05/31/23 1603     Visit Number 1    Number of Visits 1    Date for OT Re-Evaluation 06/01/23    Authorization Type [employer]    OT Start Time 1518    OT Stop Time 1552    OT Time Calculation (min) 34 min    Activity Tolerance Patient tolerated treatment well    Behavior During Therapy [facility] for tasks assessed/performed             History reviewed. No pertinent past medical history. History reviewed. No pertinent surgical history. There are no problems to display for this patient.   ONSET DATE: 10/2022  REFERRING DIAG: R lateral epicondylitis  THERAPY DIAG:  Pain in right forearm  Other symptoms and signs involving the musculoskeletal system  Rationale for Evaluation and Treatment: Rehabilitation  SUBJECTIVE:   SUBJECTIVE STATEMENT: "I have shooting pain that wakes me up at night" Pt accompanied by: self  PERTINENT HISTORY: Pt has no prior medical history and reports this pain began approximately 6 months ago, intermittently and tweaking with overuse and lifting.   PRECAUTIONS: None  WEIGHT BEARING RESTRICTIONS: No  PAIN:  Are you having pain? No  FALLS: Has patient fallen in last 6 months? No  LIVING ENVIRONMENT: Lives with: lives with their spouse Lives in: House/apartment  PLOF: Independent  PATIENT GOALS: Full Range of movement without pain.   NEXT MD VISIT: none  OBJECTIVE:   HAND DOMINANCE: Right  ADLs: Overall ADLs: Pt reports pain with lifting any item of any weight, limiting his ability to complete ADL's and especially IADL's effectively and efficiently.   UPPER EXTREMITY ROM:     Active ROM Right eval  Shoulder internal rotation 90  Shoulder external rotation 58  Elbow flexion 138  Elbow  extension 0  Wrist flexion 66  Wrist extension 47  Wrist ulnar deviation 22  Wrist radial deviation 40  Wrist pronation WFL  Wrist supination WFL  (Blank rows = not tested)  UPPER EXTREMITY MMT:     MMT Right eval  Shoulder internal rotation 5/5  Shoulder external rotation 4+/5  Elbow flexion 5/5  Elbow extension 5/5  Wrist flexion 5/5  Wrist extension 5/5  Wrist ulnar deviation 5/5  Wrist radial deviation 5/5  Wrist pronation 4+/5  Wrist supination 5/5  (Blank rows = not tested)  HAND FUNCTION: Grip strength: Right: 72 lbs; Left: 80 lbs  SENSATION: Tingling in hand intermittently  EDEMA: Mild stiffness in fingers intermittently  OBSERVATIONS: Moderate fascial restrictions along the extensor bundle of the forearm.    TODAY'S TREATMENT:  DATE: 05/30/23: Evaluation Only    PATIENT EDUCATION: Education details: Wrist ROM and strengthening Person educated: Patient Education method: Explanation, Demonstration, and Handouts Education comprehension: verbalized understanding and returned demonstration  HOME EXERCISE PROGRAM: Wrist ROM and Strengthening   ASSESSMENT:  CLINICAL IMPRESSION: Patient is a 62 y.o. male who was seen today for occupational therapy evaluation for R lateral epicondylitis pain. Pt presents with good ROM and minimal weakness with external rotation and pronation, none of which are limiting his ADL's or IADL's. Reviewed manual techniques and wrist ROM to assist with decreasing pain, as well as reviewed lessening repetitive motions as able to decrease the restrictions and pain. Pt reports understanding and has no further skilled OT needs, will be discharged from Outpatient OT.    PERFORMANCE DEFICITS: in functional skills including pain and fascial restrictions.  IMPAIRMENTS: are limiting patient from work.   COMORBIDITIES: has  no other co-morbidities that affects occupational performance. Patient will benefit from skilled OT to address above impairments and improve overall function.  MODIFICATION OR ASSISTANCE TO COMPLETE EVALUATION: No modification of tasks or assist necessary to complete an evaluation.  OT OCCUPATIONAL PROFILE AND HISTORY: Problem focused assessment: Including review of records relating to presenting problem.  CLINICAL DECISION MAKING: LOW - limited treatment options, no task modification necessary  REHAB POTENTIAL: Excellent  EVALUATION COMPLEXITY: Low      PLAN:  OT FREQUENCY: one time visit  OT DURATION: other: Evaluation Only  PLANNED INTERVENTIONS: self care/ADL training and therapeutic exercise  RECOMMENDED OTHER SERVICES: N/A  CONSULTED AND AGREED WITH PLAN OF CARE: Patient  PLAN FOR NEXT SESSION: None, Evaluation Only   Trish Mage, OTR/L [facility] [phone] Kennyth Arnold, OT 05/31/2023, 4:04 PM

## 2024-01-29 ENCOUNTER — Ambulatory Visit (HOSPITAL_COMMUNITY)
Admission: RE | Admit: 2024-01-29 | Discharge: 2024-01-29 | Disposition: A | Discharge status: Home / Self Care | Source: Ambulatory Visit | Attending: Internal Medicine | Admitting: Internal Medicine

## 2024-01-29 ENCOUNTER — Other Ambulatory Visit (HOSPITAL_COMMUNITY): Payer: Self-pay | Admitting: Internal Medicine

## 2024-01-29 DIAGNOSIS — R059 Cough, unspecified: Secondary | ICD-10-CM

## 2024-11-29 ENCOUNTER — Emergency Department (HOSPITAL_COMMUNITY)
Admission: EM | Admit: 2024-11-29 | Discharge: 2024-11-29 | Disposition: A | Discharge status: Home / Self Care | Attending: Emergency Medicine | Admitting: Emergency Medicine

## 2024-11-29 ENCOUNTER — Emergency Department (HOSPITAL_COMMUNITY)

## 2024-11-29 ENCOUNTER — Encounter (HOSPITAL_COMMUNITY): Payer: Self-pay | Admitting: *Deleted

## 2024-11-29 ENCOUNTER — Other Ambulatory Visit: Payer: Self-pay

## 2024-11-29 DIAGNOSIS — W000XXA Fall on same level due to ice and snow, initial encounter: Secondary | ICD-10-CM | POA: Insufficient documentation

## 2024-11-29 DIAGNOSIS — S20222A Contusion of left back wall of thorax, initial encounter: Secondary | ICD-10-CM

## 2024-11-29 DIAGNOSIS — Z7982 Long term (current) use of aspirin: Secondary | ICD-10-CM | POA: Insufficient documentation

## 2024-11-29 DIAGNOSIS — M545 Low back pain, unspecified: Secondary | ICD-10-CM | POA: Insufficient documentation

## 2024-11-29 MED ORDER — METHOCARBAMOL 500 MG PO TABS
500.0000 mg | ORAL_TABLET | Freq: Once | ORAL | Status: AC
  Administered 2024-11-29: 500 mg via ORAL
  Filled 2024-11-29: qty 1

## 2024-11-29 MED ORDER — LIDOCAINE 5 % EX PTCH
1.0000 | MEDICATED_PATCH | CUTANEOUS | Status: DC
  Administered 2024-11-29: 1 via TRANSDERMAL
  Filled 2024-11-29: qty 1

## 2024-11-29 MED ORDER — METHOCARBAMOL 500 MG PO TABS: 500.0000 mg | ORAL_TABLET | Freq: Four times a day (QID) | ORAL | 0 refills | Status: AC | PRN

## 2024-11-29 MED ORDER — LIDOCAINE 5 % EX PTCH: 1.0000 | MEDICATED_PATCH | CUTANEOUS | 0 refills | Status: AC

## 2024-11-29 NOTE — ED Notes (Signed)
 Patient transported to X-ray

## 2024-11-29 NOTE — ED Provider Notes (Signed)
 "  EMERGENCY DEPARTMENT AT [facility] Provider Note   CSN: [id number] Arrival date & time: 11/29/24  1450     Patient presents with: Bill Garcia is a 64 y.o. male.  Presents to ER for left low back pain that occurred yesterday, he states he slipped on ice and fell hitting his left lower back area on the step, no head injury or loss of consciousness.  He is not on blood thinners, he states the pain is worse when he tries to stand up and walk, denies saddle esthesia or paresthesia, no bowel or bladder incontinence, no fevers or chills.  He is able to ambulate but states it is painful to stand fully up.  He has tried ibuprofen  100 mg with only minimal improvement the pain.    Fall       Prior to Admission medications  Medication Sig Start Date End Date Taking? Authorizing Provider  aspirin  EC 81 MG tablet Take 81 mg by mouth daily.    [provider]  ibuprofen  (ADVIL ,MOTRIN ) 800 MG tablet Take 1 tablet (800 mg total) by mouth 3 (three) times daily. 04/14/18   Mesner, Selinda, MD  ondansetron  (ZOFRAN ) 4 MG tablet Take 1 tablet (4 mg total) by mouth every 8 (eight) hours as needed for nausea or vomiting. 04/14/18   Mesner, Selinda, MD  oxyCODONE -acetaminophen  (PERCOCET/ROXICET) 5-325 MG tablet Take 1-2 tablets by mouth every 8 (eight) hours as needed for severe pain. 04/14/18   Mesner, Selinda, MD  tamsulosin  (FLOMAX ) 0.4 MG CAPS capsule Take 1 capsule (0.4 mg total) by mouth daily. 04/14/18   Mesner, Selinda, MD    Allergies: Patient has no known allergies.    Review of Systems  Updated Vital Signs BP (!) 151/85 (BP Location: Right Arm)   Pulse 94   Temp 98.2 F (36.8 C) (Oral)   Resp 17   Ht 5' 8 (1.727 m)   Wt 79.4 kg   SpO2 99%   BMI 26.61 kg/m   Physical Exam Vitals and nursing note reviewed.  Constitutional:      General: He is not in acute distress.    Appearance: He is well-developed.  HENT:     Head: Normocephalic and atraumatic.      Mouth/Throat:     Mouth: Mucous membranes are moist.  Eyes:     Extraocular Movements: Extraocular movements intact.     Conjunctiva/sclera: Conjunctivae normal.     Pupils: Pupils are equal, round, and reactive to light.  Cardiovascular:     Rate and Rhythm: Normal rate and regular rhythm.     Heart sounds: No murmur heard. Pulmonary:     Effort: Pulmonary effort is normal. No respiratory distress.     Breath sounds: Normal breath sounds.  Abdominal:     Palpations: Abdomen is soft.     Tenderness: There is no abdominal tenderness.  Musculoskeletal:        General: No swelling.     Cervical back: Neck supple.     Comments: To the left lumbar area, minimal lumbar tenderness of midline that is not point tender.  There is no swelling or bruising.  Patient can flex and extend but has pain on full extension, he can rotate left and right but has pain with this as well.  Normal strength and sensation of bilateral lower extremities.  Skin:    General: Skin is warm and dry.     Capillary Refill: Capillary refill takes less than  2 seconds.  Neurological:     General: No focal deficit present.     Mental Status: He is alert and oriented to person, place, and time.  Psychiatric:        Mood and Affect: Mood normal.     (all labs ordered are listed, but only abnormal results are displayed) Labs Reviewed - No data to display  EKG: None  Radiology: DG Lumbar Spine Complete Result Date: 11/29/2024 CLINICAL DATA:  fall, pain EXAM: LUMBAR SPINE - COMPLETE 4+ VIEW COMPARISON:  October 17, 2010 FINDINGS: There are five non-rib bearing lumbar-type vertebral bodies. There is normal alignment. No acute compression fracture deformity. Intervertebral disc spaces are relatively preserved with minimal endplate proliferative changes of the upper and mid lumbar spine. Atherosclerotic calcifications. IMPRESSION: 1. No acute compression fracture deformity. 2. If persistent clinical concern for traumatic  injury, recommend dedicated cross-sectional imaging. Electronically Signed   By: Corean Salter M.D.   On: 11/29/2024 16:37     Procedures   Medications Ordered in the ED  lidocaine  (LIDODERM ) 5 % 1 patch (1 patch Transdermal Patch Applied 11/29/24 1629)  methocarbamol  (ROBAXIN ) tablet 500 mg (500 mg Oral Given 11/29/24 1629)                                    Medical Decision Making Differential diagnosis includes but not limited to fracture, sprain, strain, muscle spasm, contusion, other  Course: Patient presents to the ER today for evaluation of left low back pain after fall yesterday.  Mechanical fall due to slipping on ice, no saddle esthesia or paresthesia, no bowel or bladder incontinence, pain is primarily in the lateral lumbar area but he did have some minimal tenderness in the midline so x-rays were obtained that showed no acute fractures.  Given minimal midline tenderness do not feel he needs further workup such as imaging with CT.  Do not feel he needs labs.  He is feeling much better after lidocaine  patch and methocarbamol , he has ibuprofen  at home to take as well, advised on follow-up and strict return precautions.  Patient is ambulatory in the ED   Amount and/or Complexity of Data Reviewed Radiology: ordered.  Risk Prescription drug management.        Final diagnoses:  None    ED Discharge Orders     None          Suellen Sherran DELENA DEVONNA 11/29/24 1902  "

## 2024-11-29 NOTE — Discharge Instructions (Addendum)
 You were seen in the ED for low back pain after a fall. Your X rays do not show a fracture. We are treating your pain with muscle relaxers and lidocaine  patches. Come back to the ED for new or worsening symptoms.

## 2024-11-29 NOTE — ED Triage Notes (Signed)
 Pt slipped and fell yesterday and hitting the corner of step to his left lower back. Denies hitting his head.
